# Patient Record
Sex: Female | Born: 1965 | Hispanic: Yes | Marital: Married | Smoking: Never smoker
Health system: Southern US, Community
[De-identification: ages and names within clinical notes are randomized; demographics above are authoritative.]

## PROBLEM LIST (undated history)

## (undated) HISTORY — PX: TUBAL LIGATION: SHX77

---

## 2005-11-07 ENCOUNTER — Ambulatory Visit: Payer: Self-pay | Admitting: Obstetrics and Gynecology

## 2006-01-15 ENCOUNTER — Inpatient Hospital Stay: Payer: Self-pay | Admitting: Certified Nurse Midwife

## 2008-06-02 ENCOUNTER — Ambulatory Visit: Payer: Self-pay

## 2010-04-11 ENCOUNTER — Ambulatory Visit: Payer: Self-pay | Admitting: Family Medicine

## 2011-07-19 ENCOUNTER — Ambulatory Visit: Payer: Self-pay

## 2013-04-10 ENCOUNTER — Observation Stay: Payer: Self-pay | Admitting: Internal Medicine

## 2013-04-10 LAB — URINALYSIS, COMPLETE
Bilirubin,UR: NEGATIVE
Glucose,UR: NEGATIVE mg/dL (ref 0–75)
Ketone: NEGATIVE
Ph: 6 (ref 4.5–8.0)
RBC,UR: 4 /HPF (ref 0–5)

## 2013-04-10 LAB — COMPREHENSIVE METABOLIC PANEL
Albumin: 3.9 g/dL (ref 3.4–5.0)
Alkaline Phosphatase: 78 U/L (ref 50–136)
Anion Gap: 6 — ABNORMAL LOW (ref 7–16)
Bilirubin,Total: 0.4 mg/dL (ref 0.2–1.0)
Calcium, Total: 8.8 mg/dL (ref 8.5–10.1)
Chloride: 104 mmol/L (ref 98–107)
Co2: 27 mmol/L (ref 21–32)
Creatinine: 0.61 mg/dL (ref 0.60–1.30)
Glucose: 88 mg/dL (ref 65–99)
Osmolality: 272 (ref 275–301)
Potassium: 3.7 mmol/L (ref 3.5–5.1)
SGOT(AST): 18 U/L (ref 15–37)
SGPT (ALT): 22 U/L (ref 12–78)
Sodium: 137 mmol/L (ref 136–145)
Total Protein: 8.2 g/dL (ref 6.4–8.2)

## 2013-04-10 LAB — CBC
HCT: 38.4 % (ref 35.0–47.0)
MCH: 29.7 pg (ref 26.0–34.0)
MCV: 88 fL (ref 80–100)
RBC: 4.34 10*6/uL (ref 3.80–5.20)

## 2013-04-10 LAB — CK TOTAL AND CKMB (NOT AT ARMC)
CK, Total: 101 U/L (ref 21–215)
CK-MB: 1.1 ng/mL (ref 0.5–3.6)

## 2013-04-10 LAB — TROPONIN I: Troponin-I: <0.02 ng/mL

## 2013-04-10 LAB — PREGNANCY, URINE: Pregnancy Test, Urine: NEGATIVE m[IU]/mL

## 2013-05-15 ENCOUNTER — Ambulatory Visit: Payer: Self-pay

## 2014-11-05 ENCOUNTER — Ambulatory Visit: Payer: Self-pay

## 2015-01-01 IMAGING — CT CT HEAD WITHOUT CONTRAST
1 series · 16 of 29 positions shown, 20 images · non-contrast
Comparison: none

REASON FOR EXAM: syncope, ?trauma
COMMENTS:

PROCEDURE:     CT  - CT HEAD WITHOUT CONTRAST  - April 10, 2013  [DATE]
RESULT:     Comparison:  None
TECHNIQUE: Multiple axial images from the foramen magnum to the vertex were
obtained without IV contrast.

[Series 2: soft tissue · axial · 0.38mm/px · z∈[-55,+75]mm · 16 of 29 slices shown, 20 images]
[im 2/29  brain]
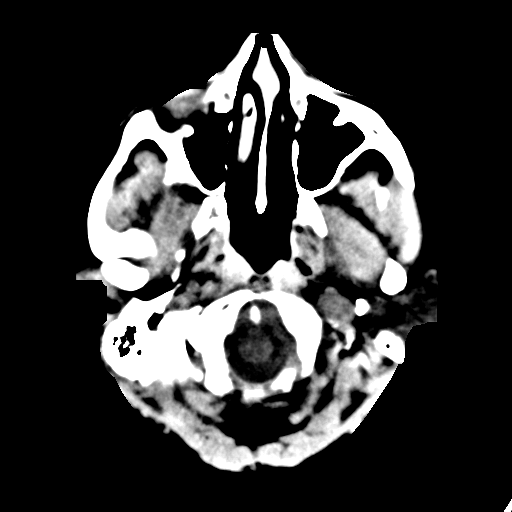
[im 2/29  bone]
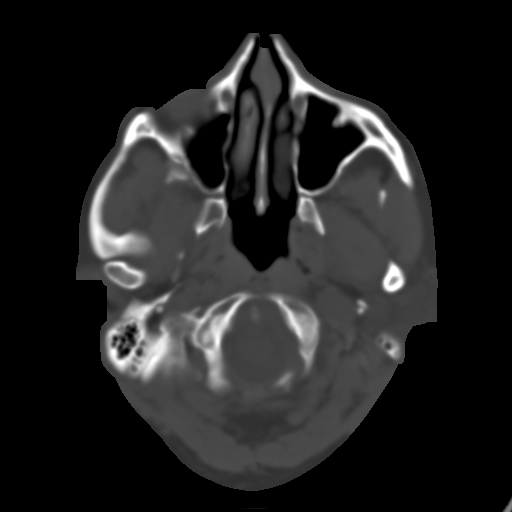
[im 4/29  brain]
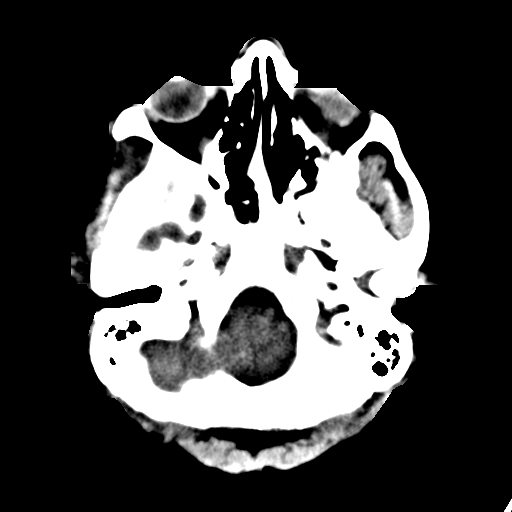
[im 6/29  brain]
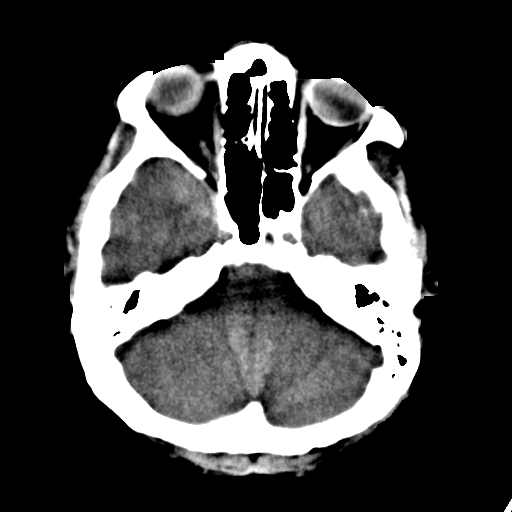
[im 7/29  brain]
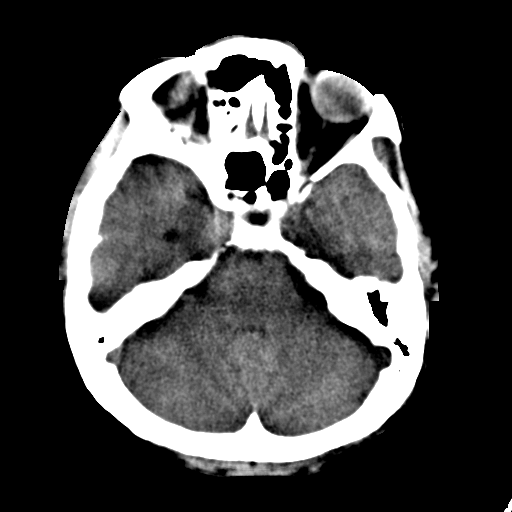
[im 9/29  brain]
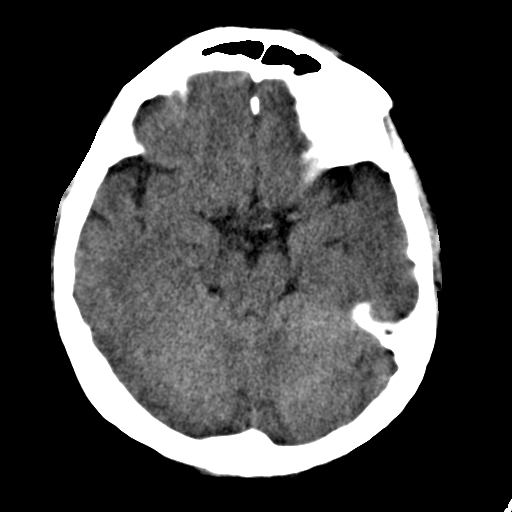
[im 9/29  bone]
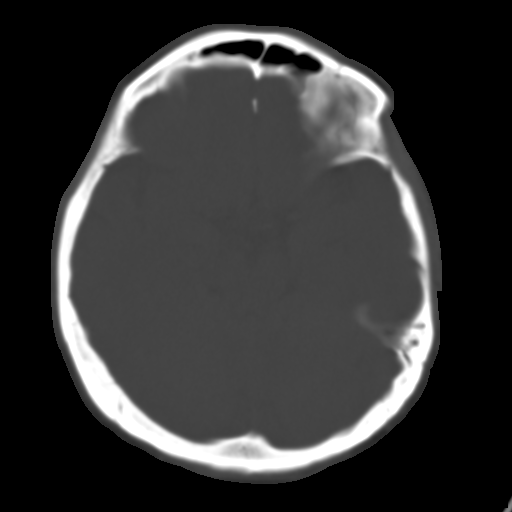
[im 11/29  brain]
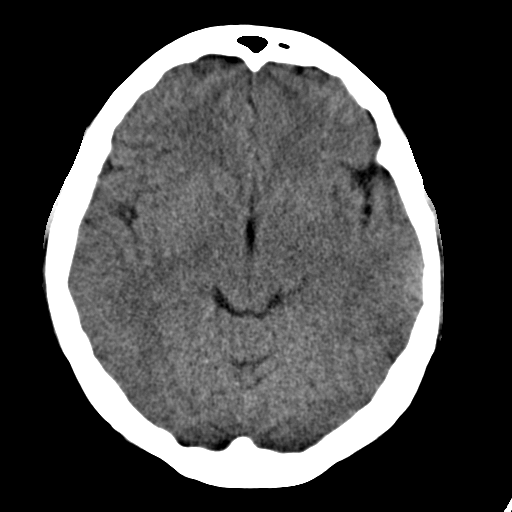
[im 12/29  brain]
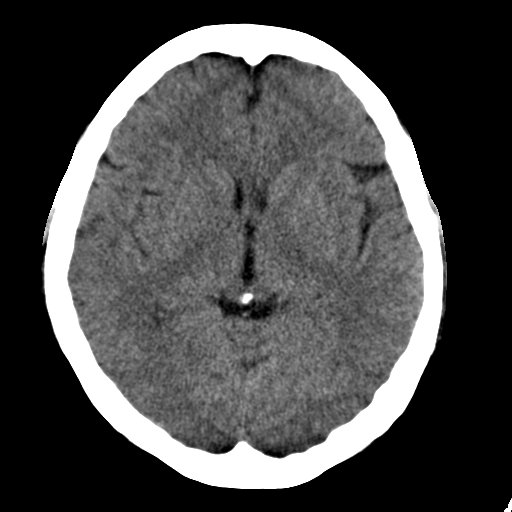
[im 14/29  brain]
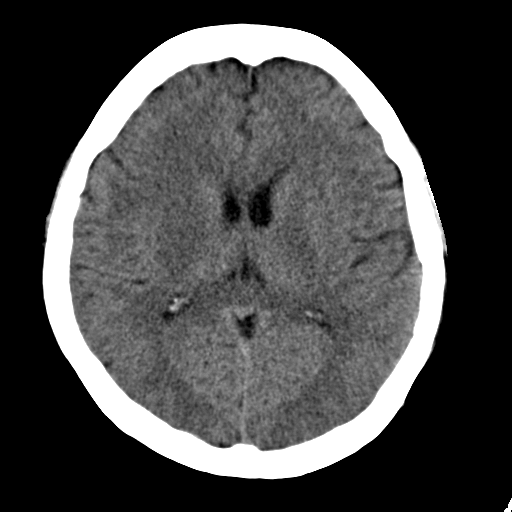
[im 16/29  brain]
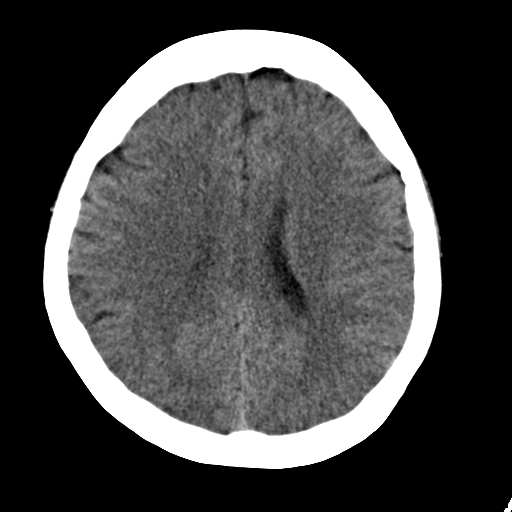
[im 16/29  bone]
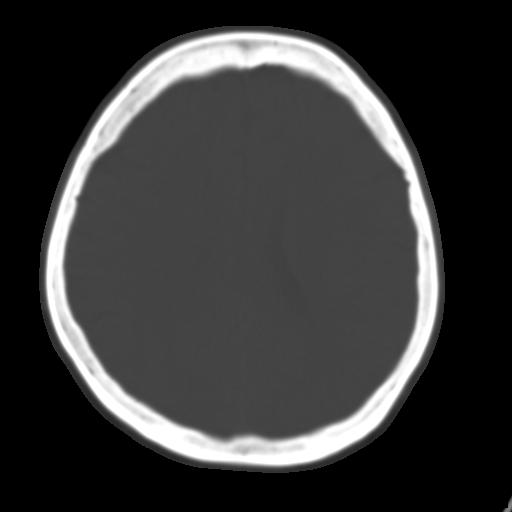
[im 18/29  brain]
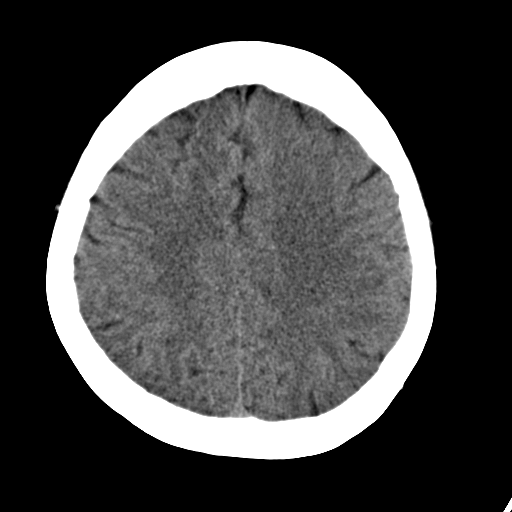
[im 19/29  brain]
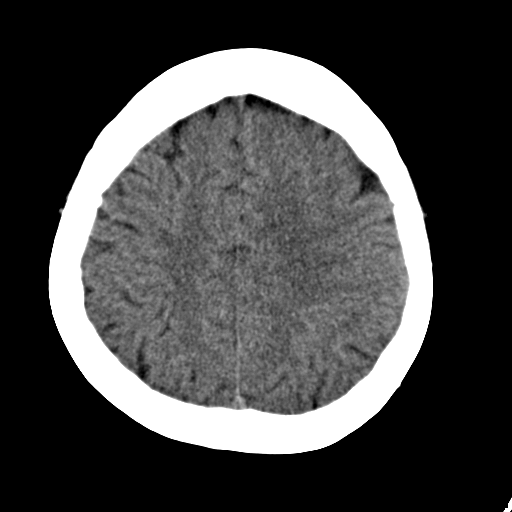
[im 21/29  brain]
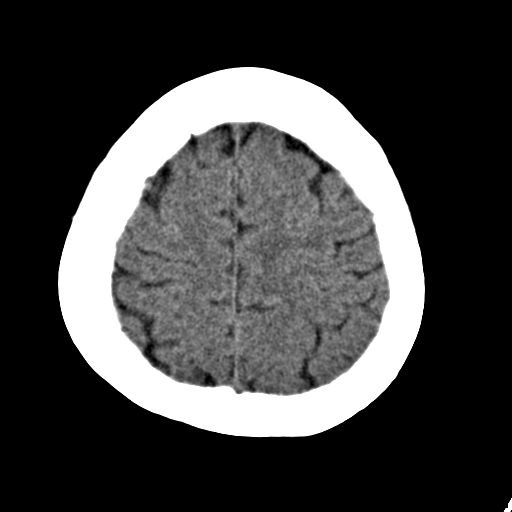
[im 23/29  brain]
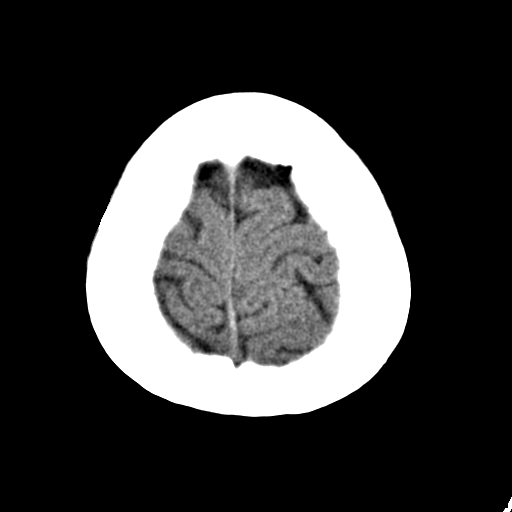
[im 23/29  bone]
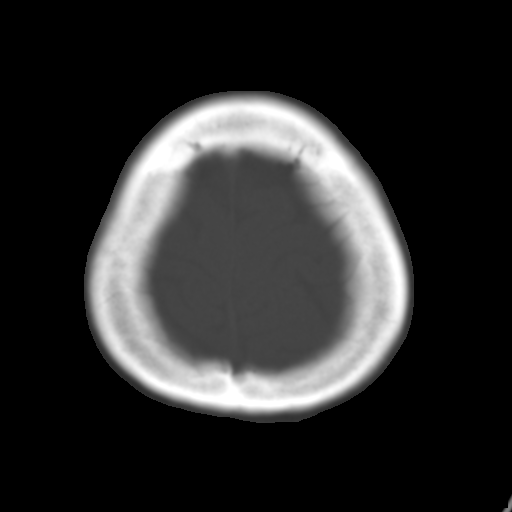
[im 24/29  brain]
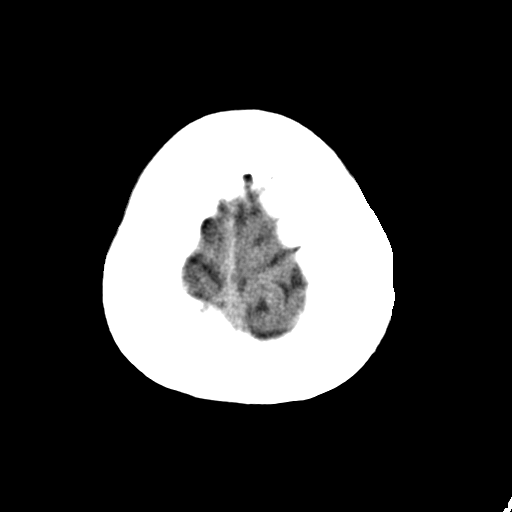
[im 26/29  brain]
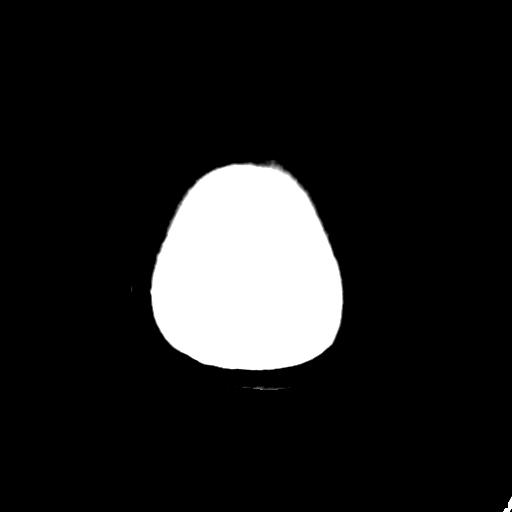
[im 28/29  brain]
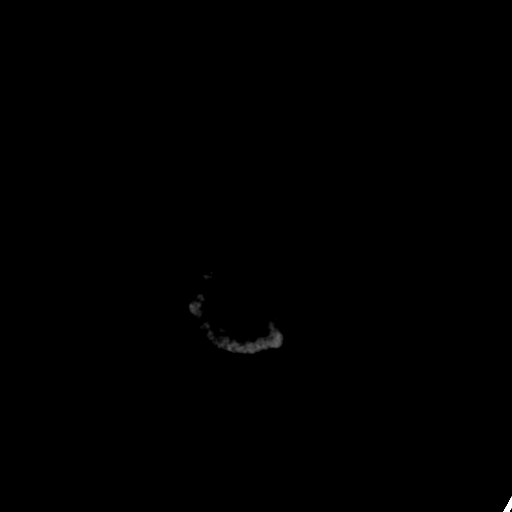

[16 of 29 positions shown; findings below may reference images not displayed]

FINDINGS: There is no evidence for mass effect, midline shift, or extra-axial fluid
collections. There is no evidence for space-occupying lesion, intracranial
hemorrhage, or cortical-based area of infarction.

The osseous structures are unremarkable.
IMPRESSION: No acute intracranial process.

## 2015-04-15 NOTE — Discharge Summary (Signed)
PATIENT NAME:  Julie Frye, [name] MR#:  [id number] DATE OF BIRTH:  1966-03-17  DATE OF ADMISSION:  04/10/2013 DATE OF DISCHARGE:  04/10/2013  DISCHARGE DIAGNOSES:  1.  Syncopal episode most likely due to sleep deprivation. 2.  Urinary tract infection.  3.  MRI of brain and troponins plus telemetry are negative.   CONDITION ON DISCHARGE: Stable.   CODE STATUS: FULL CODE.   DISCHARGE DIAGNOSES:  Levofloxacin 500 mg oral tablet once a day for 4 days.   DIET ON DISCHARGE:  Regular. Diet consistency regular.   ACTIVITY LIMITATION: As tolerated.  TIME FRAME TO FOLLOWUP:  In 2 to 4 weeks, routine follow up with PMD.  Advised to get good sleep and rest.   PRIMARY CARE PHYSICIAN:  Acquanetta BellingAngela Lugiano, CNM  HISTORY OF PRESENT ILLNESS: The patient is a 49 year old female with no known past medical history, following with her primary care doctor regularly, and has check-ups at her company also because of employment. She was not taking any medication and says that she was at her work, on the day of presentation.  She usually works night shift for almost a year. At 5 a.m. in the morning, she was feeling extremely dizzy and weak and so she walked to the bathroom to wash her face.  She washed her face with cool water and just after that she started feeling very dizzy and blacked out.  Her coworker was there in the bathroom at that time and she noticed that her face is turning red. She was starting to fall down.  Denied any other complaints. She did not have any abnormal movements while she passed out. So she was brought to the Emergency Room for further evaluation. She had some rough times going on with her kid and so was unable to sleep properly in the daytime. As per her husband, for the last 1 to 2 weeks she was sleeping on 3 hours to maybe 4 hours in the daytime and working the night shift.  HOSPITAL COURSE AND STAY:  1.  Syncopal episode. It was most likely due to sleep deprivation, but she was admitted to make  sure that it was not any stroke or due to arrhythmia. She was admitted on telemetry floor. Cardiac rhythm was continuously monitored. Troponins were followed and they remained negative. CT of the head was negative on presentation. MRI was done, which did not show any acute finding.  2.  Urinary tract infection. She did not have any symptoms, but had bacteria and leukocyte esterase positive in the urine.  She was started and continued on Levaquin and was feeling better.   IMPORTANT LABORATORY RESULTS IN THE HOSPITAL: BMP was normal. Liver function panel normal. Creatinine less than 0.02, followed 2 times.  CBC is normal. On MRI of the head:  No evidence of local or acute abnormality as described above.   TOTAL TIME SPENT ON THIS DISCHARGE: 45 minutes.  ____________________________ Hope PigeonVaibhavkumar G. Elisabeth PigeonVachhani, MD vgv:sb D: 04/12/2013 21:42:51 ET T: 04/13/2013 08:10:49 ET JOB#: [id number]  cc: Hope PigeonVaibhavkumar G. Elisabeth PigeonVachhani, MD, <Dictator> Cydney OkAngela R. Lugiano, CNM Altamese DillingVAIBHAVKUMAR Marce Schartz MD ELECTRONICALLY SIGNED 04/18/2013 22:44

## 2015-04-15 NOTE — H&P (Signed)
PATIENT NAME:  Julie Frye, Julie Frye MR#:  [id number] DATE OF BIRTH:  1966/12/16  DATE OF ADMISSION:  04/10/2013  REFERRING ER PHYSICIAN: Dr. Manson Passey.  FAMILY PHYSICIAN:  Acquanetta Belling, CNM  TRANSLATOR:  The patient's husband.    CHIEF COMPLAINT: Syncopal episode.   HISTORY OF PRESENT ILLNESS:  The patient is a Spanish-speaking lady, speaks little [place]. Husband is present in the room and history obtained with the help of husband. The patient is also able to answer half of the questions on her own.    This is a 49 year old female with no known past medical history. She is following with her primary care doctor regularly and has check-ups with her company also because of employment.  Not taking any medication. Says that today she was at work. She usually works the night shift for the last 1 year. At 5:00 a.m., when she was at work, she was feeling extremely dizzy and weak and so she walked to the bathroom to wash her face. She was feeling dizzy while walking to the bathroom. She went to the bathroom, washed her face with cold water, wiped it and just after that she started feeling black out. Her coworker was there in the bathroom, at the same time, and the patient says that she was starting to fall down, trying to grab the wall, and slowly slumped over and just went to the floor. She was hearing distant voices of somebody at that time. On further questioning, she says that she felt her heart was beating faster before the episode, but denies any shortness of breath, any chest pain or any episode of vertigo.  Her coworker also did not notice any abnormal or jerky movements of the body. On further questioning, husband says that she has been working with the night schedule for almost a year, but for the last couple of weeks due to was some problem with one of their kids they are having a rough time and she is not able to sleep enough. She is only sleeping 3 to 4 hours in daytime and working in the night and so  gradually getting weak and dizzy.  Before and after the episode, the patient also denies any feeling of weakness in specific parts of the body. She had some numbness in her fingers, but that is gone now.  REVIEW OF SYSTEMS: CONSTITUTIONAL: Negative for fever, fatigue, weakness, pain or weight loss.  EYES: No blurring, double vision, pain or redness.  EARS, NOSE, THROAT: No tinnitus, ear pain, hearing loss.  RESPIRATORY: No cough, wheezing, hemoptysis.  CARDIOVASCULAR: No chest pain, orthopnea, edema or arrhythmia.  Had a syncopal episode.  GASTROINTESTINAL: No nausea, vomiting, diarrhea or abdominal pain.  GENITOURINARY: No dysuria, hematuria or increased frequency.  ENDOCRINOLOGY: No heat or cold intolerance.  SKIN: No acne, rashes or lesions on the skin.  MUSCULOSKELETAL: No pain or swelling in the joints.  NEUROLOGICAL: Had some mild numbness in fingers before the episode, but denies any abnormal jerky movements or any leftover weakness after the episode.   PAST MEDICAL HISTORY: Negative for anything. She is following with her doctor regularly.   HOME MEDICATIONS: None.   PAST SURGICAL HISTORY: None.   SOCIAL HISTORY: She denies smoking, drinking alcohol or illegal drug use. Works the night shift in a [employer] Lives with her family. Having some stress with her kids in the last couple of week.   FAMILY HISTORY: Negative for diabetes or coronary artery disease.   LABORATORY AND DIAGNOSTICS:  Glucose 88,  BUN 10, creatinine 0.61, sodium 137, potassium 3.7, chloride 104, CO2 27, calcium 8.8.   Total protein 8.2, albumin 3.9, bilirubin 0.4, alkaline phosphatase 78, SGOT 18, SGPT 22.   Troponin less than 0.02. WBC 8.6, hemoglobin 12.9, platelet count 241, MCV 88.   Urinalysis:  Leukocyte esterase 2+, WBCs 2, and hazy color.  Bacteria trace in the urine.  Pregnancy test negative.   CT of the head negative. Chest x-ray negative for any abnormal findings.    PHYSICAL  EXAMINATION: VITAL SIGNS: In ER, temperature 98.7, pulse rate 64, respirations 18, blood pressure 127/71 and pulse ox 100.  GENERAL: She is fully alert, oriented to time, place and person and does not appear in any acute distress. She is cooperative with history taking and physical examination.  HEENT: Head and neck atraumatic. Conjunctivae pink. Oral moist.  NECK: Supple. No JVD.  LUNGS: Bilateral clear and equal air entry.  HEART:  S1 and S2 present, regular. No murmur appreciated. No carotid bruit heard.  ABDOMEN: Soft and nontender. Bowel sounds present.  SKIN: No rashes.  JOINTS: No swelling or tenderness.  LEGS: No edema.  NEUROLOGICAL: Power 5/5 in all 4 limbs. No tremors.  PSYCHIATRIC: Does not appear in any acute psychiatric illness at this time.   ASSESSMENT AND PLAN:  A 49 year old female with no significant past medical history who presented with episode of syncope today while working the night shift, early morning.  1.  Syncopal episode. Most likely due to sleep deprivation. We will try to look for any cardiac or central nervous system causes. Will follow her troponin every 8 hours. First one is negative.  Will monitor her on telemetry floor for any abnormal cardiac rhythms.  To rule out any central nervous system etiologies, CT of the head is negative, we will do MRI to look for any stroke or lacunar infarct, although very less likely.  2.  Urinary tract infection. She does not have any symptoms, but there are a few bacteria and leukocyte esterase positive in the urine with presence of WBCs. I will send a urine culture and give her Levaquin for now.  I spoke to her and her husband that if the initial work-up comes out negative and she is feeling fine she might be discharged in a day or 2.            CODE STATUS: FULL CODE.   TOTAL TIME SPENT: 50 minutes.  ____________________________ Hope PigeonVaibhavkumar G. Elisabeth PigeonVachhani, MD vgv:sb D: 04/10/2013 10:52:32 ET T: 04/10/2013 11:22:47  ET JOB#: [id number]  cc: Hope PigeonVaibhavkumar G. Elisabeth PigeonVachhani, MD, <Dictator> Altamese DillingVAIBHAVKUMAR Nelissa Bolduc MD ELECTRONICALLY SIGNED 04/15/2013 14:28

## 2016-10-06 ENCOUNTER — Emergency Department: Payer: No Typology Code available for payment source

## 2016-10-06 ENCOUNTER — Encounter: Payer: Self-pay | Admitting: Emergency Medicine

## 2016-10-06 ENCOUNTER — Emergency Department
Admission: EM | Admit: 2016-10-06 | Discharge: 2016-10-06 | Disposition: A | Discharge status: Home / Self Care | Payer: No Typology Code available for payment source | Attending: Emergency Medicine | Admitting: Emergency Medicine

## 2016-10-06 DIAGNOSIS — Y92410 Unspecified street and highway as the place of occurrence of the external cause: Secondary | ICD-10-CM | POA: Insufficient documentation

## 2016-10-06 DIAGNOSIS — Y9389 Activity, other specified: Secondary | ICD-10-CM | POA: Diagnosis not present

## 2016-10-06 DIAGNOSIS — T148XXA Other injury of unspecified body region, initial encounter: Secondary | ICD-10-CM | POA: Diagnosis not present

## 2016-10-06 DIAGNOSIS — M79632 Pain in left forearm: Secondary | ICD-10-CM | POA: Insufficient documentation

## 2016-10-06 DIAGNOSIS — M79602 Pain in left arm: Secondary | ICD-10-CM | POA: Diagnosis present

## 2016-10-06 DIAGNOSIS — Y999 Unspecified external cause status: Secondary | ICD-10-CM | POA: Diagnosis not present

## 2016-10-06 DIAGNOSIS — M542 Cervicalgia: Secondary | ICD-10-CM | POA: Insufficient documentation
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
## 2018-03-24 ENCOUNTER — Other Ambulatory Visit: Payer: Self-pay

## 2018-03-27 LAB — CYTOLOGY - PAP: Diagnosis: NEGATIVE

## 2021-05-06 ENCOUNTER — Encounter: Payer: Self-pay | Admitting: Emergency Medicine

## 2021-05-06 ENCOUNTER — Other Ambulatory Visit: Payer: Self-pay

## 2021-05-06 ENCOUNTER — Ambulatory Visit (INDEPENDENT_AMBULATORY_CARE_PROVIDER_SITE_OTHER): Payer: 59

## 2021-05-06 ENCOUNTER — Ambulatory Visit
Admission: EM | Admit: 2021-05-06 | Discharge: 2021-05-06 | Disposition: A | Discharge status: Home / Self Care | Payer: 59

## 2021-05-06 DIAGNOSIS — M722 Plantar fascial fibromatosis: Secondary | ICD-10-CM

## 2021-05-06 DIAGNOSIS — M79672 Pain in left foot: Secondary | ICD-10-CM | POA: Diagnosis not present

## 2021-05-06 MED ORDER — NAPROXEN 500 MG PO TABS: 500.0000 mg | ORAL_TABLET | Freq: Two times a day (BID) | ORAL | 0 refills | Status: AC

## 2021-05-06 NOTE — ED Triage Notes (Signed)
Patient c/o left heel pain that started 3 weeks ago.  Patient reports ongoing pain in her left 2nd toe due to past injury that was seen at her orthopedic.

## 2021-05-06 NOTE — Discharge Instructions (Signed)
See provided information about plantar fasciitis.  Your foot xray is normal today which is reassuring.  Naproxen twice a day to help with pain. Don't take additional ibuprofen and take this with food.  Wear supportive shoes, particularly while working.  Follow up with podiatry if no improvement, for further evaluation and management.   Consulte la informacin proporcionada sobre la fascitis plantar. La radiografa de su pie es normal hoy, lo cual es tranquilizador. Naproxeno dos veces al da para ayudar con Chief Technology Officer. No tome ibuprofeno adicional y tmelo con comida. Use zapatos de apoyo, especialmente mientras trabaja. Seguimiento con podologa si no mejora, para una evaluacin y [employer].

## 2021-05-06 NOTE — ED Provider Notes (Signed)
[facility]    CSN: [id number] Arrival date & time: 05/06/21  1339      History   Chief Complaint Chief Complaint  Patient presents with  . Foot Pain    Left heel    HPI Julie Frye is a 55 y.o. female.   Julie Frye presents with complaints of left foot pain. Originally with left foot 2nd toe pain which was normal on xray, 1 year ago. Around 2-3 weeks ago started noted pain to her arch and heel, worse when she first wakes and plants her foot. She works in Public affairs consultant, on her feet a lot. No known injury. No redness swelling or warmth.     ROS per HPI, negative if not otherwise mentioned.      History reviewed. No pertinent past medical history.  There are no problems to display for this patient.   History reviewed. No pertinent surgical history.  OB History   No obstetric history on file.      Home Medications    Prior to Admission medications   Medication Sig Start Date End Date Taking? Authorizing Provider  ibuprofen (ADVIL) 200 MG tablet Take by mouth.   Yes [provider]  naproxen (NAPROSYN) 500 MG tablet Take 1 tablet (500 mg total) by mouth 2 (two) times daily. 05/06/21  Yes Georgetta Haber, NP    Family History History reviewed. No pertinent family history.  Social History Social History   Tobacco Use  . Smoking status: Never Smoker  . Smokeless tobacco: Never Used  Vaping Use  . Vaping Use: Never used  Substance Use Topics  . Alcohol use: No  . Drug use: Never     Allergies   Patient has no known allergies.   Review of Systems Review of Systems   Physical Exam Triage Vital Signs ED Triage Vitals  Enc Vitals Group     BP 05/06/21 1445 (!) 147/72     Pulse Rate 05/06/21 1445 (!) 54     Resp 05/06/21 1445 15     Temp 05/06/21 1445 98.1 F (36.7 C)     Temp Source 05/06/21 1445 Oral     SpO2 05/06/21 1445 96 %     Weight 05/06/21 1442 179 lb 14.3 oz (81.6 kg)     Height 05/06/21 1442 5\' 2"   (1.575 m)     Head Circumference --      Peak Flow --      Pain Score 05/06/21 1441 7     Pain Loc --      Pain Edu? --      Excl. in GC? --    No data found.  Updated Vital Signs BP (!) 147/72 (BP Location: Left Arm)   Pulse (!) 54   Temp 98.1 F (36.7 C) (Oral)   Resp 15   Ht 5\' 2"  (1.575 m)   Wt 179 lb 14.3 oz (81.6 kg)   SpO2 96%   BMI 32.90 kg/m   Visual Acuity Right Eye Distance:   Left Eye Distance:   Bilateral Distance:    Right Eye Near:   Left Eye Near:    Bilateral Near:     Physical Exam Constitutional:      General: She is not in acute distress.    Appearance: She is well-developed.  Cardiovascular:     Rate and Rhythm: Normal rate.  Pulmonary:     Effort: Pulmonary effort is normal.  Musculoskeletal:       Feet:  Feet:     Left foot:     Skin integrity: No skin breakdown, erythema or warmth.     Comments: Tenderness to left foot plantar fascia on exam; no redness swelling or warmth Skin:    General: Skin is warm and dry.  Neurological:     Mental Status: She is alert and oriented to person, place, and time.      UC Treatments / Results  Labs (all labs ordered are listed, but only abnormal results are displayed) Labs Reviewed - No data to display  EKG   Radiology DG Foot Complete Left  Result Date: 05/06/2021 CLINICAL DATA:  c/o left heel pain that started 3 weeks ago. Patient reports ongoing pain in her left 2nd toe due to past injury that was seen about at her orthopedic. EXAM: LEFT FOOT - COMPLETE 3+ VIEW COMPARISON:  None. FINDINGS: There is no evidence of fracture or dislocation. There is no evidence of arthropathy or other focal bone abnormality. Small calcaneal spur. Soft tissues are unremarkable. IMPRESSION: No acute osseous abnormality in the left foot. Electronically Signed   By: Emmaline Kluver M.D.   On: 05/06/2021 15:32    Procedures Procedures (including critical care time)  Medications Ordered in UC Medications - No  data to display  Initial Impression / Assessment and Plan / UC Course  I have reviewed the triage vital signs and the nursing notes.  Pertinent labs & imaging results that were available during my care of the patient were reviewed by me and considered in my medical decision making (see chart for details).     History and exam consistent with plantar fasciitis . Pain management and expected course of rehab discussed. Podiatry recommendation provided as well. Patient verbalized understanding and agreeable to plan.   Final Clinical Impressions(s) / UC Diagnoses   Final diagnoses:  Plantar fasciitis of left foot     Discharge Instructions     See provided information about plantar fasciitis.  Your foot xray is normal today which is reassuring.  Naproxen twice a day to help with pain. Don't take additional ibuprofen and take this with food.  Wear supportive shoes, particularly while working.  Follow up with podiatry if no improvement, for further evaluation and management.   Consulte la informacin proporcionada sobre la fascitis plantar. La radiografa de su pie es normal hoy, lo cual es tranquilizador. Naproxeno dos veces al da para ayudar con Chief Technology Officer. No tome ibuprofeno adicional y tmelo con comida. Use zapatos de apoyo, especialmente mientras trabaja. Seguimiento con podologa si no mejora, para una evaluacin y [employer].    ED Prescriptions    Medication Sig Dispense Auth. Provider   naproxen (NAPROSYN) 500 MG tablet Take 1 tablet (500 mg total) by mouth 2 (two) times daily. 30 tablet Georgetta Haber, NP     PDMP not reviewed this encounter.   Georgetta Haber, NP 05/06/21 [phone]

## 2022-05-30 DIAGNOSIS — M79672 Pain in left foot: Secondary | ICD-10-CM | POA: Diagnosis not present

## 2022-05-30 DIAGNOSIS — R001 Bradycardia, unspecified: Secondary | ICD-10-CM | POA: Diagnosis not present

## 2022-05-30 DIAGNOSIS — Z131 Encounter for screening for diabetes mellitus: Secondary | ICD-10-CM | POA: Diagnosis not present

## 2022-05-30 DIAGNOSIS — Z7189 Other specified counseling: Secondary | ICD-10-CM | POA: Diagnosis not present

## 2022-05-30 DIAGNOSIS — Z1159 Encounter for screening for other viral diseases: Secondary | ICD-10-CM | POA: Diagnosis not present

## 2022-05-30 DIAGNOSIS — Z1211 Encounter for screening for malignant neoplasm of colon: Secondary | ICD-10-CM | POA: Diagnosis not present

## 2022-05-30 DIAGNOSIS — Z1389 Encounter for screening for other disorder: Secondary | ICD-10-CM | POA: Diagnosis not present

## 2022-06-08 ENCOUNTER — Other Ambulatory Visit: Payer: Self-pay | Admitting: Nurse Practitioner

## 2022-06-08 DIAGNOSIS — Z1231 Encounter for screening mammogram for malignant neoplasm of breast: Secondary | ICD-10-CM

## 2022-06-12 DIAGNOSIS — Z1211 Encounter for screening for malignant neoplasm of colon: Secondary | ICD-10-CM | POA: Diagnosis not present

## 2022-07-09 DIAGNOSIS — Z1389 Encounter for screening for other disorder: Secondary | ICD-10-CM | POA: Diagnosis not present

## 2022-07-09 DIAGNOSIS — Z124 Encounter for screening for malignant neoplasm of cervix: Secondary | ICD-10-CM | POA: Diagnosis not present

## 2022-07-09 DIAGNOSIS — Z0000 Encounter for general adult medical examination without abnormal findings: Secondary | ICD-10-CM | POA: Diagnosis not present

## 2022-07-09 DIAGNOSIS — R87610 Atypical squamous cells of undetermined significance on cytologic smear of cervix (ASC-US): Secondary | ICD-10-CM | POA: Diagnosis not present

## 2022-07-26 ENCOUNTER — Ambulatory Visit
Admission: RE | Admit: 2022-07-26 | Discharge: 2022-07-26 | Disposition: A | Discharge status: Home / Self Care | Payer: 59 | Source: Ambulatory Visit | Attending: Nurse Practitioner | Admitting: Nurse Practitioner

## 2022-07-26 DIAGNOSIS — Z1231 Encounter for screening mammogram for malignant neoplasm of breast: Secondary | ICD-10-CM | POA: Diagnosis not present

## 2023-01-27 IMAGING — CR DG FOOT COMPLETE 3+V*L*
3 series · 3 of 3 positions shown · non-contrast
Comparison: None.

CLINICAL DATA: c/o left heel pain that started 3 weeks ago. Patient
reports ongoing pain in her left 2nd toe due to past injury that was
seen about at her orthopedic.

EXAM:
LEFT FOOT - COMPLETE 3+ VIEW

[foot ap]
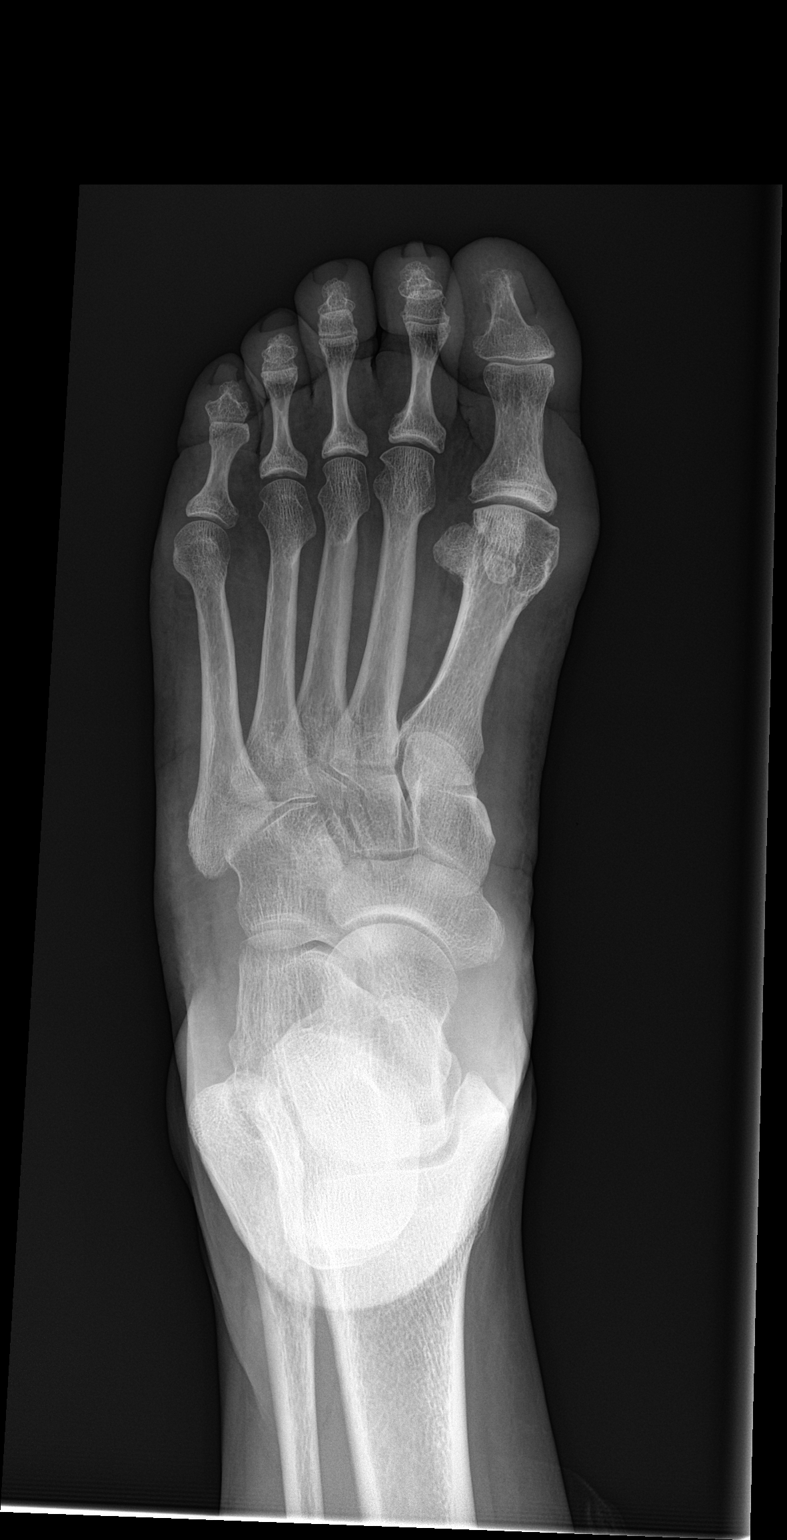

[foot obl]
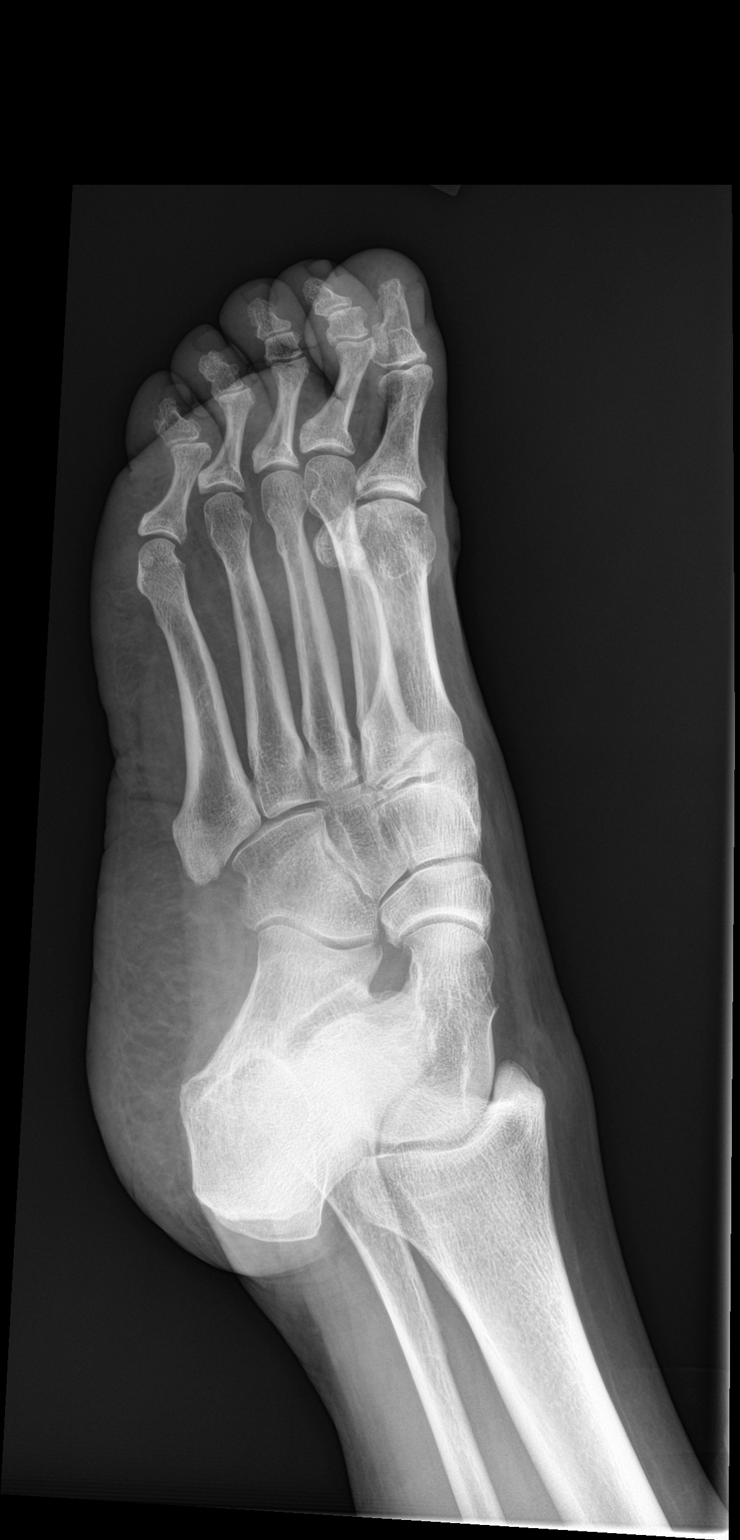

[foot lat]
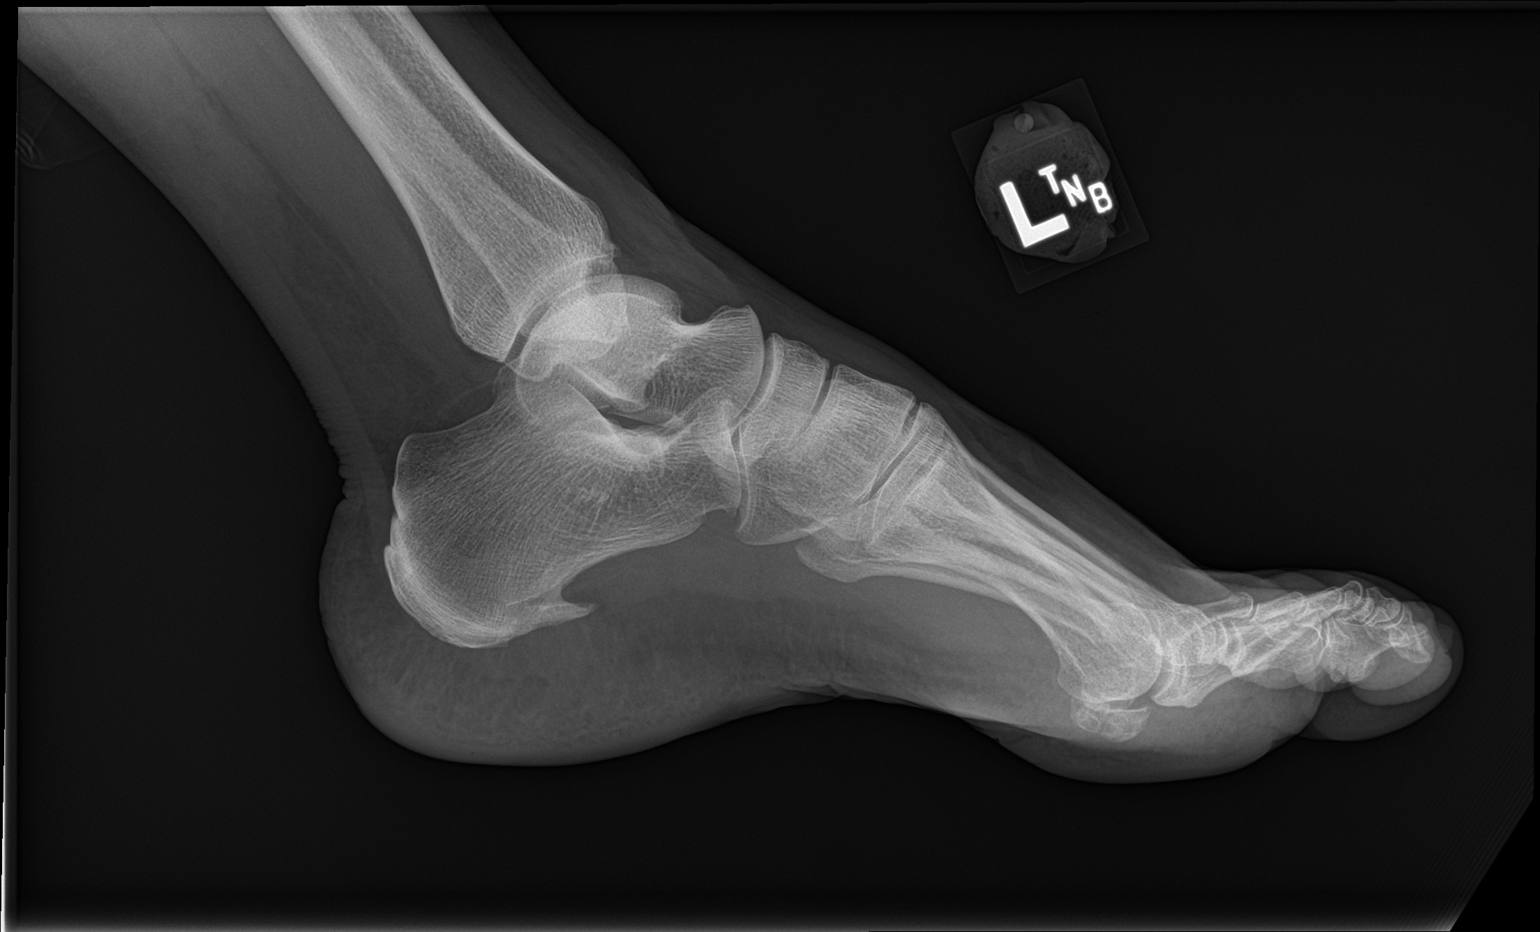

[3 of 3 positions shown; findings below may reference images not displayed]

FINDINGS: There is no evidence of fracture or dislocation. There is no
evidence of arthropathy or other focal bone abnormality. Small
calcaneal spur. Soft tissues are unremarkable.
IMPRESSION: No acute osseous abnormality in the left foot.

## 2023-11-13 ENCOUNTER — Ambulatory Visit: Payer: 59 | Admitting: Obstetrics

## 2023-12-04 NOTE — Progress Notes (Unsigned)
Referring Provider:  Jodi Marble, NP   HPI:  Julie Frye is a 57 y.o.  [id number] who presents today for evaluation and management of abnormal cervical cytology.  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   Prior pap smears:  Date:06/2022 Pap ascus: HPV positive  Prior cervical / vaginal findings: n/a   Prior cervical treatment(s): n/a  Symptoms/History:  -Abnormal vaginal discharge: no -Postmenopausal: yes -Intermenstrual bleeding: no -Postcoital bleeding: no -Bleeding problems (non-gyn): no -Contraception: no -Number of current sexual partners: 1 -Number of partners in lifetime: 1 -History of a high risk partner: no -History of STDs: no -Smoking: no      ROS:  Pertinent items are noted in HPI.  GYN History:  Menopausal since Jan 2016   OB History  Gravida Para Term Preterm AB Living  6 4 4  2 4   SAB IAB Ectopic Multiple Live Births          # Outcome Date GA Lbr Len/2nd Weight Sex Type Anes PTL Lv  6 Term      Vag-Spont     5 Term      Vag-Spont     4 Term      Vag-Spont     3 Term      Vag-Spont     2 AB           1 AB             No past medical history on file.  Past Surgical History:  Procedure Laterality Date   TUBAL LIGATION      SOCIAL HISTORY:  Social History   Substance and Sexual Activity  Alcohol Use No    Social History   Substance and Sexual Activity  Drug Use Never     Family History  Problem Relation Age of Onset   Breast cancer Maternal Aunt     ALLERGIES:  Patient has no known allergies.  She has a current medication list which includes the following prescription(s): ibuprofen and naproxen.  Physical Exam: -Vitals:  BP (!) 146/75   Pulse (!) 52   Ht 5\' 2"  (1.575 m)   Wt 198 lb (89.8 kg)   BMI 36.21 kg/m   PROCEDURE: Colposcopy performed with 4% acetic acid after verbal consent obtained   Physical  Exam Genitourinary:                                 -Aceto-white Lesions Location(s): at 6:00 and 8:00              -Biopsy performed at 6:00, 8:00, and 9:00 o'clock               -ECC indicated and performed: Yes.       -Biopsy sites made hemostatic with pressure and Monsel's solution   -Satisfactory colposcopy: Yes.      -Evidence of Invasive cervical CA :  NO  ASSESSMENT:  Julie Frye is a 57 y.o. 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  -ECC and 3 cervical bx sent to pathology; 9:00 biopsy of the cervical mass -- not a polyp at the os, but confluent with the ectocervix. See images taken.  -Aftercare instructions for home reviewed, si/sx of when to call/return discussed. -RTC  2 weeks for results, sooner prn   Julieanne Manson, DO [facility]

## 2023-12-05 ENCOUNTER — Other Ambulatory Visit (HOSPITAL_COMMUNITY)
Admission: RE | Admit: 2023-12-05 | Discharge: 2023-12-05 | Disposition: A | Discharge status: Home / Self Care | Payer: 59 | Source: Ambulatory Visit | Attending: Obstetrics | Admitting: Obstetrics

## 2023-12-05 ENCOUNTER — Encounter: Payer: Self-pay | Admitting: Obstetrics

## 2023-12-05 ENCOUNTER — Ambulatory Visit: Payer: Self-pay | Admitting: Obstetrics

## 2023-12-05 VITALS — BP 146/75 | HR 52 | Ht 62.0 in | Wt 198.0 lb

## 2023-12-05 DIAGNOSIS — N888 Other specified noninflammatory disorders of cervix uteri: Secondary | ICD-10-CM

## 2023-12-05 DIAGNOSIS — N870 Mild cervical dysplasia: Secondary | ICD-10-CM

## 2023-12-05 DIAGNOSIS — R87810 Cervical high risk human papillomavirus (HPV) DNA test positive: Secondary | ICD-10-CM

## 2023-12-05 DIAGNOSIS — R87610 Atypical squamous cells of undetermined significance on cytologic smear of cervix (ASC-US): Secondary | ICD-10-CM | POA: Diagnosis present

## 2023-12-05 NOTE — Addendum Note (Signed)
Addended by: Cornelius Moras D on: 12/05/2023 10:54 AM   Modules accepted: Orders

## 2023-12-06 LAB — SURGICAL PATHOLOGY

## 2023-12-24 ENCOUNTER — Encounter: Payer: Self-pay | Admitting: Obstetrics

## 2023-12-24 ENCOUNTER — Ambulatory Visit: Payer: 59 | Admitting: Obstetrics

## 2023-12-24 VITALS — BP 138/64 | HR 53 | Ht 62.0 in | Wt 199.0 lb

## 2023-12-24 DIAGNOSIS — N870 Mild cervical dysplasia: Secondary | ICD-10-CM

## 2023-12-24 NOTE — Patient Instructions (Signed)
 Loop Electrosurgical Excision Procedure Loop electrosurgical excision procedure (LEEP) is the cutting and removal (excision) of tissue from the cervix. The cervix is the bottom part of the uterus that opens into the vagina. The tissue that is removed from the cervix is examined to see if there are cancer cells or cells that might turn into cancer (precancerous cells). LEEP may be done when: You have abnormal bleeding from your cervix. You have an abnormal Pap test result. Your health care provider finds abnormalities on your cervix during an exam. LEEP typically only takes a few minutes and is often done in the health care provider's office. The procedure is safe for women who are trying to get pregnant. The procedure is usually not done during a menstrual period or during pregnancy. Tell a health care provider about: Any allergies you have. All medicines you are taking, including vitamins, herbs, eye drops, creams, and over-the-counter medicines. Any problems you or family members have had with anesthetic medicines. Any bleeding problems you have. Any medical conditions you have or have had. This includes current or past vaginal infections, such as herpes or STIs (sexually transmitted infections). Whether you are pregnant or may be pregnant. If you are having vaginal bleeding on the day of the procedure. What are the risks? Generally, this is a safe procedure. However, problems may occur, including: Infection. Bleeding. Allergic reactions to medicines. Changes or scarring in the cervix. Damage to nearby structures or organs. Increased risk of early (preterm) labor in future pregnancies. What happens before the procedure? Ask your health care provider about: Changing or stopping your regular medicines. This is especially important if you are taking diabetes medicines or blood thinners. Taking medicines such as aspirin and ibuprofen . These medicines can thin your blood. Do not take these  medicines unless your health care provider tells you to take them. Taking over-the-counter medicines, vitamins, herbs, and supplements. Your health care provider may recommend that you take pain medicine before the procedure. Ask your health care provider if you should plan to have a responsible adult take you home after the procedure. What happens during the procedure?  An instrument called a speculum will be placed in your vagina. This will allow your health care provider to see your cervix. You will be given a medicine to numb the area (local anesthetic). The medicine will be injected into your cervix and the surrounding area. A solution will be applied to your cervix. This solution will help the health care provider find the abnormal cells that need to be removed. A thin wire loop will be passed through your vagina to your cervix. The wire will remove layers of abnormal cervical cells. The wire will burn (cauterize) the cervical tissue with an electrical current during cell removal. Open blood vessels will be cauterized to prevent bleeding. You might feel some pressure, aching, and cramping. If you feel like you will faint during the procedure, tell your health care provider right away. A paste may be applied to the cauterized area of your cervix to help control bleeding. The sample of cervical tissue will be sent to a lab and looked at under a microscope. The procedure may vary among health care providers and hospitals. What can I expect after the procedure? After the procedure, it is common to have: Mild abdominal cramps that may last for up to 1 week. A small amount of pink-tinged or bloody vaginal discharge, including light to moderate bleeding, for 1-2 weeks. A brown- or black-colored discharge coming from your  vagina, if a paste was used on the cervix to control bleeding. It is up to you to get the results of your procedure. Ask your health care provider, or the department that is  doing the procedure, when your results will be ready. Follow these instructions at home: Take over-the-counter and prescription medicines only as told by your health care provider. Return to your normal activities as told by your health care provider. Ask your health care provider what activities are safe for you. Do not put anything in your vagina for 2 weeks after the procedure or until your health care provider says that it is okay. This includes tampons, creams, and douches. Do not have sex until your health care provider approves. Keep all follow-up visits. This is important. Contact a health care provider if: You have a fever or chills. You feel very weak. You have blood clots or bleeding that is heavier than a normal menstrual period. Bleeding that soaks a pad in less than 1 hour is considered heavy bleeding. You develop a bad-smelling discharge from your vagina. You have severe abdominal pain or cramping. Summary Loop electrosurgical excision procedure (LEEP) is the removal of tissue from the cervix. The removed tissue will be checked for precancerous cells or cancer cells. LEEP typically only takes a few minutes and is often done in your health care provider's office. Do not put anything in your vagina for 2 weeks after the procedure or until your health care provider says that it is okay. This includes tampons, creams, and douches. Ask your health care provider, or the department that is doing the procedure, when your results will be ready. This information is not intended to replace advice given to you by your health care provider. Make sure you discuss any questions you have with your health care provider. Document Revised: 05/17/2021 Document Reviewed: 05/17/2021 Elsevier Patient Education  2024 [employer].

## 2023-12-24 NOTE — Progress Notes (Signed)
    GYNECOLOGY PROGRESS NOTE  Subjective:  PCP: [facility]  Patient ID: Julie Frye, female    DOB: [birth year], 57 y.o.   MRN: [id number]  Live, Spanish interpreter present for encounter today and facilitated patient communiciation.   HPI  Patient is a 57 y.o. [id number] female who presents for colposcopy results from 12/05/23.    The following portions of the patient's history were reviewed and updated as appropriate: allergies, current medications, past family history, past medical history, past social history, past surgical history, and problem list.  Review of Systems Pertinent items are noted in HPI.   Objective:   Blood pressure 138/64, pulse (!) 53, height 5' 2 (1.575 m), weight 199 lb (90.3 kg). Body mass index is 36.4 kg/m.  General appearance: alert, cooperative, and mildly obese Extremities: extremities normal, atraumatic, no cyanosis or edema Neurologic: Grossly normal  PATHOLOGY FINAL MICROSCOPIC DIAGNOSIS:   A. ENDOCERVIX, CURETTAGE:  Low-grade squamous intraepithelial lesion, CIN-1.  Scanty benign endocervical mucosa.   B. CERVIX, 6 O'CLOCK, BIOPSY:  Cervicitis and focal koilocytic change suspicious for low-grade squamous  intraepithelial lesion, CIN-1.   C. CERVIX, 8 O'CLOCK, BIOPSY:  Benign cervical mucosa.   D. CERVIX, 9 O'CLOCK POLYPECTOMY:  Low-grade squamous intraepithelial lesion, CIN-1.    Assessment/Plan:   1. Dysplasia of cervix, low grade (CIN 1)   Pathology reviewed with patient. ASCCP guidelines recommend repeat pap with cotesting in 12 mos.  All questions answered to patient's satisfaction.    Estil Mangle, DO [facility]
